# Patient Record
Sex: Female | Born: 1965 | Race: White | Hispanic: No | Marital: Married
Health system: Midwestern US, Community
[De-identification: ages and names within clinical notes are randomized; demographics above are authoritative.]

## PROBLEM LIST (undated history)

## (undated) DIAGNOSIS — N979 Female infertility, unspecified: Secondary | ICD-10-CM

## (undated) DIAGNOSIS — T7840XA Allergy, unspecified, initial encounter: Secondary | ICD-10-CM

## (undated) DIAGNOSIS — C4491 Basal cell carcinoma of skin, unspecified: Secondary | ICD-10-CM

## (undated) DIAGNOSIS — M2242 Chondromalacia patellae, left knee: Secondary | ICD-10-CM

## (undated) DIAGNOSIS — M1990 Unspecified osteoarthritis, unspecified site: Secondary | ICD-10-CM

## (undated) DIAGNOSIS — E785 Hyperlipidemia, unspecified: Secondary | ICD-10-CM

## (undated) HISTORY — DX: Female infertility, unspecified: N97.9

## (undated) HISTORY — DX: Allergy, unspecified, initial encounter: T78.40XA

## (undated) HISTORY — DX: Basal cell carcinoma of skin, unspecified: C44.91

## (undated) HISTORY — PX: COLONOSCOPY: SHX174

## (undated) HISTORY — DX: Unspecified osteoarthritis, unspecified site: M19.90

## (undated) HISTORY — DX: Hyperlipidemia, unspecified: E78.5

## (undated) HISTORY — PX: GYNECOLOGIC CRYOSURGERY: SHX857

## (undated) HISTORY — PX: POLYPECTOMY: SHX149

## (undated) HISTORY — DX: Chondromalacia patellae, left knee: M22.42

## (undated) HISTORY — PX: BREAST ENHANCEMENT SURGERY: SHX7

---

## 1998-05-20 ENCOUNTER — Other Ambulatory Visit: Admission: RE | Admit: 1998-05-20 | Discharge: 1998-05-20 | Payer: Self-pay | Admitting: Obstetrics and Gynecology

## 1998-11-11 ENCOUNTER — Other Ambulatory Visit: Admission: RE | Admit: 1998-11-11 | Discharge: 1998-11-11 | Payer: Self-pay | Admitting: Obstetrics and Gynecology

## 1999-08-26 ENCOUNTER — Other Ambulatory Visit: Admission: RE | Admit: 1999-08-26 | Discharge: 1999-08-26 | Payer: Self-pay | Admitting: Obstetrics and Gynecology

## 2000-02-23 ENCOUNTER — Other Ambulatory Visit: Admission: RE | Admit: 2000-02-23 | Discharge: 2000-02-23 | Payer: Self-pay | Admitting: Obstetrics and Gynecology

## 2000-03-22 ENCOUNTER — Other Ambulatory Visit: Admission: RE | Admit: 2000-03-22 | Discharge: 2000-03-22 | Payer: Self-pay | Admitting: Obstetrics and Gynecology

## 2000-03-22 ENCOUNTER — Encounter (INDEPENDENT_AMBULATORY_CARE_PROVIDER_SITE_OTHER): Payer: Self-pay

## 2000-08-21 ENCOUNTER — Other Ambulatory Visit: Admission: RE | Admit: 2000-08-21 | Discharge: 2000-08-21 | Payer: Self-pay | Admitting: Obstetrics and Gynecology

## 2001-03-08 ENCOUNTER — Other Ambulatory Visit: Admission: RE | Admit: 2001-03-08 | Discharge: 2001-03-08 | Payer: Self-pay | Admitting: Obstetrics and Gynecology

## 2001-09-04 ENCOUNTER — Other Ambulatory Visit: Admission: RE | Admit: 2001-09-04 | Discharge: 2001-09-04 | Payer: Self-pay | Admitting: Obstetrics and Gynecology

## 2002-03-25 ENCOUNTER — Other Ambulatory Visit: Admission: RE | Admit: 2002-03-25 | Discharge: 2002-03-25 | Payer: Self-pay | Admitting: Obstetrics and Gynecology

## 2002-09-02 ENCOUNTER — Other Ambulatory Visit: Admission: RE | Admit: 2002-09-02 | Discharge: 2002-09-02 | Payer: Self-pay | Admitting: Obstetrics and Gynecology

## 2003-12-29 ENCOUNTER — Other Ambulatory Visit: Admission: RE | Admit: 2003-12-29 | Discharge: 2003-12-29 | Payer: Self-pay | Admitting: Obstetrics and Gynecology

## 2004-06-29 ENCOUNTER — Other Ambulatory Visit: Admission: RE | Admit: 2004-06-29 | Discharge: 2004-06-29 | Payer: Self-pay | Admitting: Obstetrics and Gynecology

## 2005-01-04 ENCOUNTER — Other Ambulatory Visit: Admission: RE | Admit: 2005-01-04 | Discharge: 2005-01-04 | Payer: Self-pay | Admitting: Obstetrics and Gynecology

## 2005-06-09 ENCOUNTER — Other Ambulatory Visit: Admission: RE | Admit: 2005-06-09 | Discharge: 2005-06-09 | Payer: Self-pay | Admitting: Obstetrics and Gynecology

## 2008-08-01 ENCOUNTER — Ambulatory Visit (HOSPITAL_COMMUNITY): Admission: RE | Admit: 2008-08-01 | Discharge: 2008-08-01 | Payer: Self-pay | Admitting: Obstetrics and Gynecology

## 2008-09-05 ENCOUNTER — Ambulatory Visit (HOSPITAL_COMMUNITY): Admission: RE | Admit: 2008-09-05 | Discharge: 2008-09-05 | Payer: Self-pay | Admitting: Obstetrics and Gynecology

## 2008-11-02 ENCOUNTER — Inpatient Hospital Stay (HOSPITAL_COMMUNITY): Admission: AD | Admit: 2008-11-02 | Discharge: 2008-11-02 | Payer: Self-pay | Admitting: Obstetrics and Gynecology

## 2008-11-10 ENCOUNTER — Encounter (INDEPENDENT_AMBULATORY_CARE_PROVIDER_SITE_OTHER): Payer: Self-pay | Admitting: Obstetrics and Gynecology

## 2008-11-10 ENCOUNTER — Inpatient Hospital Stay (HOSPITAL_COMMUNITY): Admission: AD | Admit: 2008-11-10 | Discharge: 2008-11-13 | Payer: Self-pay | Admitting: Obstetrics and Gynecology

## 2008-11-10 ENCOUNTER — Inpatient Hospital Stay (HOSPITAL_COMMUNITY): Admission: AD | Admit: 2008-11-10 | Discharge: 2008-11-10 | Payer: Self-pay | Admitting: Obstetrics & Gynecology

## 2010-08-01 ENCOUNTER — Encounter: Admission: RE | Admit: 2010-08-01 | Discharge: 2010-08-01 | Payer: Self-pay | Admitting: Neurology

## 2010-11-23 ENCOUNTER — Emergency Department (HOSPITAL_COMMUNITY)
Admission: EM | Admit: 2010-11-23 | Discharge: 2010-11-23 | Payer: Self-pay | Source: Home / Self Care | Admitting: Emergency Medicine

## 2010-11-24 LAB — DIFFERENTIAL
Basophils Absolute: 0 K/uL (ref 0.0–0.1)
Basophils Relative: 0 % (ref 0–1)
Eosinophils Absolute: 0 K/uL (ref 0.0–0.7)
Eosinophils Relative: 0 % (ref 0–5)
Lymphocytes Relative: 8 % — ABNORMAL LOW (ref 12–46)
Lymphs Abs: 1.5 K/uL (ref 0.7–4.0)
Monocytes Absolute: 1.5 K/uL — ABNORMAL HIGH (ref 0.1–1.0)
Monocytes Relative: 8 % (ref 3–12)
Neutro Abs: 15.9 K/uL — ABNORMAL HIGH (ref 1.7–7.7)
Neutrophils Relative %: 84 % — ABNORMAL HIGH (ref 43–77)

## 2010-11-24 LAB — HEPATIC FUNCTION PANEL
ALT: 14 U/L (ref 0–35)
AST: 17 U/L (ref 0–37)
Albumin: 3.6 g/dL (ref 3.5–5.2)
Alkaline Phosphatase: 47 U/L (ref 39–117)
Bilirubin, Direct: 0.1 mg/dL (ref 0.0–0.3)
Indirect Bilirubin: 0.9 mg/dL (ref 0.3–0.9)
Total Bilirubin: 1 mg/dL (ref 0.3–1.2)
Total Protein: 6.8 g/dL (ref 6.0–8.3)

## 2010-11-24 LAB — BASIC METABOLIC PANEL
BUN: 20 mg/dL (ref 6–23)
CO2: 23 mEq/L (ref 19–32)
Calcium: 8.8 mg/dL (ref 8.4–10.5)
Chloride: 108 mEq/L (ref 96–112)
Creatinine, Ser: 0.73 mg/dL (ref 0.4–1.2)
GFR calc Af Amer: >60 mL/min (ref >60)
GFR calc non Af Amer: >60 mL/min (ref >60)
Glucose, Bld: 106 mg/dL — ABNORMAL HIGH (ref 70–99)
Potassium: 4 mEq/L (ref 3.5–5.1)
Sodium: 138 mEq/L (ref 135–145)

## 2010-11-24 LAB — URINALYSIS, ROUTINE W REFLEX MICROSCOPIC
Bilirubin Urine: NEGATIVE
Hgb urine dipstick: NEGATIVE
Nitrite: NEGATIVE
Protein, ur: NEGATIVE mg/dL
Specific Gravity, Urine: 1.023 (ref 1.005–1.030)
Urine Glucose, Fasting: NEGATIVE mg/dL
Urobilinogen, UA: 0.2 mg/dL (ref 0.0–1.0)
pH: 5 (ref 5.0–8.0)

## 2010-11-24 LAB — LIPASE, BLOOD: Lipase: 24 U/L (ref 11–59)

## 2010-11-24 LAB — CBC
HCT: 39.4 % (ref 36.0–46.0)
Hemoglobin: 13.9 g/dL (ref 12.0–15.0)
MCH: 32.6 pg (ref 26.0–34.0)
MCHC: 35.3 g/dL (ref 30.0–36.0)
MCV: 92.5 fL (ref 78.0–100.0)
Platelets: 196 K/uL (ref 150–400)
RBC: 4.26 MIL/uL (ref 3.87–5.11)
RDW: 12.3 % (ref 11.5–15.5)
WBC: 18.9 K/uL — ABNORMAL HIGH (ref 4.0–10.5)

## 2010-11-24 LAB — PREGNANCY, URINE: Preg Test, Ur: NEGATIVE

## 2010-11-28 ENCOUNTER — Encounter: Payer: Self-pay | Admitting: Neurology

## 2010-11-29 ENCOUNTER — Encounter: Payer: Self-pay | Admitting: Obstetrics and Gynecology

## 2011-01-24 ENCOUNTER — Other Ambulatory Visit: Payer: Self-pay | Admitting: Internal Medicine

## 2011-01-24 DIAGNOSIS — R0789 Other chest pain: Secondary | ICD-10-CM

## 2011-01-25 ENCOUNTER — Ambulatory Visit
Admission: RE | Admit: 2011-01-25 | Discharge: 2011-01-25 | Disposition: A | Discharge status: Home / Self Care | Payer: 59 | Source: Ambulatory Visit | Attending: Internal Medicine | Admitting: Internal Medicine

## 2011-01-25 DIAGNOSIS — R0789 Other chest pain: Secondary | ICD-10-CM

## 2011-01-25 MED ORDER — IOHEXOL 300 MG/ML  SOLN
100.0000 mL | Freq: Once | INTRAMUSCULAR | Status: AC | PRN
  Administered 2011-01-25: 100 mL via INTRAVENOUS

## 2011-02-04 ENCOUNTER — Other Ambulatory Visit: Payer: Self-pay | Admitting: Internal Medicine

## 2011-02-04 DIAGNOSIS — E041 Nontoxic single thyroid nodule: Secondary | ICD-10-CM

## 2011-02-08 ENCOUNTER — Ambulatory Visit
Admission: RE | Admit: 2011-02-08 | Discharge: 2011-02-08 | Disposition: A | Discharge status: Home / Self Care | Payer: 59 | Source: Ambulatory Visit | Attending: Internal Medicine | Admitting: Internal Medicine

## 2011-02-08 DIAGNOSIS — E041 Nontoxic single thyroid nodule: Secondary | ICD-10-CM

## 2011-02-09 ENCOUNTER — Other Ambulatory Visit: Payer: Self-pay | Admitting: Internal Medicine

## 2011-02-09 DIAGNOSIS — E041 Nontoxic single thyroid nodule: Secondary | ICD-10-CM

## 2011-02-15 ENCOUNTER — Other Ambulatory Visit: Payer: Self-pay | Admitting: Interventional Radiology

## 2011-02-15 ENCOUNTER — Other Ambulatory Visit (HOSPITAL_COMMUNITY)
Admission: RE | Admit: 2011-02-15 | Discharge: 2011-02-15 | Disposition: A | Discharge status: Home / Self Care | Payer: 59 | Source: Ambulatory Visit | Attending: Interventional Radiology | Admitting: Interventional Radiology

## 2011-02-15 ENCOUNTER — Ambulatory Visit
Admission: RE | Admit: 2011-02-15 | Discharge: 2011-02-15 | Disposition: A | Discharge status: Home / Self Care | Payer: 59 | Source: Ambulatory Visit | Attending: Internal Medicine | Admitting: Internal Medicine

## 2011-02-15 DIAGNOSIS — E049 Nontoxic goiter, unspecified: Secondary | ICD-10-CM | POA: Insufficient documentation

## 2011-02-15 DIAGNOSIS — E041 Nontoxic single thyroid nodule: Secondary | ICD-10-CM

## 2011-02-21 LAB — CBC
HCT: 31.1 % — ABNORMAL LOW (ref 36.0–46.0)
HCT: 42 % (ref 36.0–46.0)
Hemoglobin: 10.8 g/dL — ABNORMAL LOW (ref 12.0–15.0)
Hemoglobin: 14.3 g/dL (ref 12.0–15.0)
MCHC: 34.1 g/dL (ref 30.0–36.0)
MCV: 97.5 fL (ref 78.0–100.0)
MCV: 97.5 fL (ref 78.0–100.0)
MCV: 98.6 fL (ref 78.0–100.0)
Platelets: 120 10*3/uL — ABNORMAL LOW (ref 150–400)
Platelets: 126 10*3/uL — ABNORMAL LOW (ref 150–400)
Platelets: 171 10*3/uL (ref 150–400)
RBC: 3.07 MIL/uL — ABNORMAL LOW (ref 3.87–5.11)
RBC: 3.15 MIL/uL — ABNORMAL LOW (ref 3.87–5.11)
RBC: 4.3 MIL/uL (ref 3.87–5.11)
RDW: 13.3 % (ref 11.5–15.5)
WBC: 10 10*3/uL (ref 4.0–10.5)
WBC: 11.1 10*3/uL — ABNORMAL HIGH (ref 4.0–10.5)
WBC: 8 10*3/uL (ref 4.0–10.5)

## 2011-02-21 LAB — TYPE AND SCREEN
ABO/RH(D): O POS
Antibody Screen: NEGATIVE

## 2011-02-21 LAB — ABO/RH: ABO/RH(D): O POS

## 2011-02-21 LAB — RPR: RPR Ser Ql: NONREACTIVE

## 2011-03-22 NOTE — Op Note (Signed)
NAMEKENLY, Kim Hester                   ACCOUNT NO.:  [id number]   MEDICAL RECORD NO.:  [id number]          PATIENT TYPE:  INP   LOCATION:  9107                          FACILITY:  [facility]   PHYSICIAN:  Duke Salvia. Marcelle Overlie, M.D.DATE OF BIRTH:  1966/03/08   DATE OF PROCEDURE:  11/10/2008  DATE OF DISCHARGE:                               OPERATIVE REPORT   PREOPERATIVE DIAGNOSIS:  Fetal intolerance to labor with fetal  tachycardia and decelerations.   POSTOPERATIVE DIAGNOSIS:  Fetal intolerance to labor with fetal  tachycardia and decelerations.   PROCEDURE:  Primary low transverse cesarean section.   SURGEON:  Duke Salvia. Marcelle Overlie, MD   ANESTHESIA:  Spinal.   COMPLICATIONS:  None.   DRAINS:  Foley catheter.   BLOOD LOSS:  700.   PROCEDURE AND FINDINGS:  The patient was taken to the operating room  after an adequate level of spinal anesthetic was obtained with the  patient in the left-tilt position.  The abdomen was prepped and draped  in the usual manner for cesarean procedure.  Foley catheter positioned,  draining clear urine.  A transverse Pfannenstiel incision made 2  fingerbreadths above the symphysis, carried down to the fascia which was  incised and extended transversely.  Rectus muscle was divided in the  midline.  Peritoneum entered superiorly without incident and extended  vertically.  The vesicouterine serosa was then incised and the bladder  was bluntly and sharply dissected below.  Bladder blade was  repositioned.  A transverse incision was made in the lower segment.  Clear fluid noted extended with bandage scissors.  The VE was used to  effect delivery easily of a female, Apgars 9 and 9, cord pH was sent.  The  infant was suctioned, cord clamped, and passed to pediatric team for  further care.  Placenta was removed manually intact, sent to pathology.  The cavity was wiped clear and noted to be clean.  Closure obtained with  a first layer of 0 chromic in a locked fashion  followed by an  imbricating layer of 0 chromic.  This was hemostatic.  The bladder flap  area was intact and hemostatic.  Urine noted to be clear at that point.  Bilateral tubes and ovaries were normal.  Prior to closure, sponge,  needle, and instrument counts were reported as correct x2.  Peritoneum  closed with running 3-0 Vicryl suture.  Zero PDS was then used to close  the fascia from laterally to midline on either side.  The subcutaneous  tissue was hemostatic.  Clips and Steri-Strips were used on the skin.  She tolerated this well, went to recovery room in good condition.  She  was already on penicillin for GBS predelivery.     Richard M. Marcelle Overlie, M.D.  Electronically Signed    RMH/MEDQ  D:  11/10/2008  T:  11/11/2008  Job:  [id number]

## 2011-03-25 NOTE — Discharge Summary (Signed)
NAMEMICHAELENE, Kim Hester                   ACCOUNT NO.:  [id number]   MEDICAL RECORD NO.:  [id number]          PATIENT TYPE:  INP   LOCATION:  9107                          FACILITY:  [facility]   PHYSICIAN:  Dineen Kid. Rana Snare, M.D.    DATE OF BIRTH:  [birth year]   DATE OF ADMISSION:  11/10/2008  DATE OF DISCHARGE:  11/13/2008                               DISCHARGE SUMMARY   ADMITTING DIAGNOSES:  1. Intrauterine pregnancy at term.  2. Spontaneous rupture of membranes.  3. Questionable cervical stenosis.   DISCHARGE DIAGNOSES:  1. Status post low transverse cesarean section secondary to      intrapartum fetal distress.  2. A viable female infant.   PROCEDURE:  Primary low transverse cesarean section.   REASON FOR ADMISSION:  Please see written H&P.   HOSPITAL COURSE:  The patient is a 45 year old primigravida, who was  admitted to [facility] at term with spontaneous rupture  of membranes.  The patient did have in vitro fertilization.  She was  known to be positive for group B beta strep; otherwise, pregnancy had  been uncomplicated.  On admission, vital signs were stable.  Fetal heart  tones were in the range of 142.  Cervix was closed, 50% effaced with  vertex presentation.  Spontaneous rupture of membranes was noted with  clear fluid.  IV antibiotics was started and Pitocin was started for  augmentation of her labor.  Shortly thereafter, fetal heart tones were  nonreassuring and decision was made to proceed with a primary low  transverse cesarean section secondary to cervix closed with questionable  stenosis, secondary to cryotherapy, and nonreassuring fetal heart tones.  The patient was then transferred to the operating room, where spinal  anesthesia was administered without difficulty.  A low transverse  incision was made with delivery of a viable female infant weighing 6  pounds 2 ounces with Apgars of 9 at 1 minute and 9 at 5 minutes.  The  patient tolerated the procedure well  and was taken to the recovery room  in stable condition.  On postoperative day #1, the patient denied  headache or blurred vision or right upper quadrant pain.  Vital signs  were stable.  Temperature max of 100.8 and blood pressure 116/73.  Abdomen soft with decreased bowel sounds.  Fundus firm and nontender.  Abdominal dressing noted to have a scant amount of drainage noted on the  bandage.  Laboratory findings revealed a hemoglobin 10.4, platelet count  of 122,000, and WBC count of 8.0.  CBC was ordered for the following  morning and vital signs were ordered to be every 4 hours.  IV was  reduced to a saline lock.  On postoperative day #2, the patient did  complain of some incisional pain.  She had been allergic to [facility]  with some associated nausea.  Vital signs were stable.  Abdomen slightly  distended with good return of bowel function.  Fundus was firm and  nontender.  Incision was clean, dry, and intact.  Laboratory findings  revealed hemoglobin of 10.8 and platelet count was  up to 126,000.  Pain  medication was changed to Dilaudid.  On postoperative day #3, the  patient was feeling better.  She had had good results in rectal  suppository and good pain relief with Dilaudid.  Vital signs were  stable.  She was afebrile.  Fundus firm and nontender.  Incision was  clean, dry, and intact.  The staples were removed and the patient was  later discharged to home.   CONDITION ON DISCHARGE:  Stable.   DIET:  Regular as tolerated.   ACTIVITY:  No heavy lifting, no driving x2 weeks, and no vaginal entry.   FOLLOWUP:  The patient is to follow up in the office in 1 week for an  incision check.  She is to call for temperature greater than 100  degrees, persistent nausea, vomiting, heavy vaginal bleeding and/or  redness or drainage from incisional site.   DISCHARGE MEDICATIONS:  1. Dilaudid 2 mg 1 p.o. every 4 hours.  2. Motrin 600 mg every 6 hours.  3. Prenatal vitamins 1 p.o. daily.   4. Colace 1 p.o. daily p.r.n.      Julio Sicks, N.P.      Dineen Kid Rana Snare, M.D.  Electronically Signed    CC/MEDQ  D:  12/11/2008  T:  12/11/2008  Job:  [id number]

## 2011-05-31 ENCOUNTER — Other Ambulatory Visit: Payer: Self-pay | Admitting: Dermatology

## 2011-06-01 ENCOUNTER — Other Ambulatory Visit: Payer: Self-pay | Admitting: Internal Medicine

## 2011-06-02 ENCOUNTER — Ambulatory Visit
Admission: RE | Admit: 2011-06-02 | Discharge: 2011-06-02 | Disposition: A | Discharge status: Home / Self Care | Payer: 59 | Source: Ambulatory Visit | Attending: Internal Medicine | Admitting: Internal Medicine

## 2011-06-06 ENCOUNTER — Other Ambulatory Visit (INDEPENDENT_AMBULATORY_CARE_PROVIDER_SITE_OTHER): Payer: 59

## 2011-06-06 ENCOUNTER — Ambulatory Visit (INDEPENDENT_AMBULATORY_CARE_PROVIDER_SITE_OTHER): Payer: 59 | Admitting: Gastroenterology

## 2011-06-06 ENCOUNTER — Encounter: Payer: Self-pay | Admitting: Gastroenterology

## 2011-06-06 DIAGNOSIS — R198 Other specified symptoms and signs involving the digestive system and abdomen: Secondary | ICD-10-CM

## 2011-06-06 DIAGNOSIS — R109 Unspecified abdominal pain: Secondary | ICD-10-CM

## 2011-06-06 LAB — COMPREHENSIVE METABOLIC PANEL
BUN: 22 mg/dL (ref 6–23)
CO2: 29 mEq/L (ref 19–32)
Calcium: 9 mg/dL (ref 8.4–10.5)
Chloride: 107 mEq/L (ref 96–112)
Creatinine, Ser: 0.6 mg/dL (ref 0.4–1.2)
GFR: 115.16 mL/min (ref >60.00)
Glucose, Bld: 99 mg/dL (ref 70–99)
Total Bilirubin: 0.5 mg/dL (ref 0.3–1.2)

## 2011-06-06 MED ORDER — PEG-KCL-NACL-NASULF-NA ASC-C 100 G PO SOLR: 1.0000 | ORAL | Status: DC

## 2011-06-06 NOTE — Patient Instructions (Addendum)
MRCP to examine biliary dilation.  [facility] 06/09/11 8 am arrive at 745 am nothing to eat or drink after midnight. You will have labs checked today in the basement lab.  Please head down after you check out with the front desk  (cmet). You will be set up for a colonoscopy for rectal discomfort. A copy of this information will be made available to Dr. Felipa Eth. We will get records from CT scan ([facility]?) this past few months.

## 2011-06-06 NOTE — Progress Notes (Signed)
HPI: This is a  very pleasant 45 year old woman who has had upper and lower GI symptoms over the past several months.  "very in tune with body".  A year ago she sat on floor, felt like she was sitting on a bubble, discomfort in anal area.  She had a Brain CT scan, normal.  In January, had intense abd pain, awoke her from sleep.  Epigastrium pain, no nausea or vomiting.  She had a BM without relief.  In ambulance pain receded.  Saw her PCP, Dr. Felipa Eth.  Had CT in January (results not available).  Had abd [place] earlier this week and it showed slightly dilated common bile duct to 8.24mm but normal gallbladder.   Cbc was normal this week.  UA +trace microscopic blood.  Liver testsmarch 2012 were normal.  Not repeated this week  One week ago she had recurrent abd pain, wax and wane throughout the day.  Radiated to back. No associated nausea or vomiting.  Took some tylenol with codiene that helped (two pills).  Episodic pain for 3 days.  Went to [facility], underwent examination and had left kidney tenderness.  Was constipated, definitely out of the ordinary.  Saw [facility].  Also mild LUQ discomfort.  Prrged with miralax late last week with good success.    Bowels a bit loose now.  No blood in stools.  + mucousy stools.    Overall stable weight.      Review of systems: Pertinent positive and negative review of systems were noted in the above HPI section.  All other review of systems was otherwise negative.   Past Medical History  Diagnosis Date  . Infertility, female   . Chondromalacia of left patella   . Hyperlipidemia   . Basal cell carcinoma     Past Surgical History  Procedure Date  . Gynecologic cryosurgery   . Breast enhancement surgery   . Cesarean section      reports that she has never smoked. She has never used smokeless tobacco. She reports that she drinks alcohol. She reports that she does not use illicit drugs.  family history includes Hypertension in her father.  There is  no history of Colon cancer.  She is adopted.    Current Medications, Allergies were all reviewed with the patient via [facility] electronic medical record system.    Physical Exam: BP 100/64  Pulse 68  Ht 5\' 2"  (1.575 m)  Wt 125 lb (56.7 kg)  BMI 22.86 kg/m2  LMP 05/15/2011 Constitutional: generally well-appearing Psychiatric: alert and oriented x3 Eyes: extraocular movements intact Mouth: oral pharynx moist, no lesions Neck: supple no lymphadenopathy Cardiovascular: heart regular rate and rhythm Lungs: clear to auscultation bilaterally Abdomen: soft, nontender, nondistended, no obvious ascites, no peritoneal signs, normal bowel sounds Extremities: no lower extremity edema bilaterally Skin: no lesions on visible extremities    Assessment and plan: 45 y.o. female with 8.1 mm common bile duct on recent ultrasound, intermittent epigastric discomforts, rectal discomfort and recent constipation  We will do records from her from March 2012 CT scan done at [facility]. It does sound as if this was normal. Her bile duct was slightly dilated on recent abdominal ultrasound and some of her symptoms do indeed seem biliary. No gallstones were seen on ultrasound but perhaps she has common bile duct pathology such as choledocholithiasis. She will get a set of liver tests today and we are going to arrange for an MRCP. A separate issue  is that of rectal discomfort, the sensation of having a bubble in her backside. She has had some mucousy stools, recent constipation and we should proceed with full colonoscopy at her soonest convenience.  Her husband is changing jobs and so insurance is an issue currently, they are going to use COBRA I tried to answer all of her questions about this she is going to call her husband HR contact as well.Marland Kitchen

## 2011-06-07 ENCOUNTER — Telehealth: Payer: Self-pay | Admitting: Gastroenterology

## 2011-06-07 ENCOUNTER — Other Ambulatory Visit: Payer: Self-pay | Admitting: Obstetrics and Gynecology

## 2011-06-07 DIAGNOSIS — R928 Other abnormal and inconclusive findings on diagnostic imaging of breast: Secondary | ICD-10-CM

## 2011-06-07 NOTE — Telephone Encounter (Signed)
I advised the pt to keep the appt for now and we will have Morrie Sheldon get a precert and if there are any problems we will call her  Pt agreed

## 2011-06-10 ENCOUNTER — Telehealth: Payer: Self-pay | Admitting: Gastroenterology

## 2011-06-10 NOTE — Telephone Encounter (Signed)
CT scan IV and PO contrast [facility], 11/2010: no acute findings, + consipation

## 2011-06-13 ENCOUNTER — Encounter: Payer: Self-pay | Admitting: Gastroenterology

## 2011-06-13 ENCOUNTER — Ambulatory Visit (AMBULATORY_SURGERY_CENTER): Payer: 59 | Admitting: Gastroenterology

## 2011-06-13 DIAGNOSIS — R109 Unspecified abdominal pain: Secondary | ICD-10-CM

## 2011-06-13 DIAGNOSIS — D126 Benign neoplasm of colon, unspecified: Secondary | ICD-10-CM

## 2011-06-13 DIAGNOSIS — R198 Other specified symptoms and signs involving the digestive system and abdomen: Secondary | ICD-10-CM

## 2011-06-13 MED ORDER — SODIUM CHLORIDE 0.9 % IV SOLN: 500.0000 mL | INTRAVENOUS | Status: DC

## 2011-06-13 NOTE — Progress Notes (Signed)
Pt c/o having nausea with any kind of anesthesia or pain medications.  Per the pt she request to go very light on the sedation.  She will discuss this with Dr. Christella Hartigan and her nurse in the procedure room.  MAW

## 2011-06-13 NOTE — Progress Notes (Signed)
Pt states that she has experienced severe nausea with anesthesia. Stated that she would like minimal sedation, MD at bedside.

## 2011-06-14 ENCOUNTER — Telehealth: Payer: Self-pay | Admitting: *Deleted

## 2011-06-14 NOTE — Telephone Encounter (Signed)

## 2011-06-15 ENCOUNTER — Ambulatory Visit
Admission: RE | Admit: 2011-06-15 | Discharge: 2011-06-15 | Disposition: A | Discharge status: Home / Self Care | Payer: 59 | Source: Ambulatory Visit | Attending: Obstetrics and Gynecology | Admitting: Obstetrics and Gynecology

## 2011-06-15 DIAGNOSIS — R928 Other abnormal and inconclusive findings on diagnostic imaging of breast: Secondary | ICD-10-CM

## 2011-06-17 ENCOUNTER — Ambulatory Visit (HOSPITAL_COMMUNITY)
Admission: RE | Admit: 2011-06-17 | Discharge: 2011-06-17 | Disposition: A | Discharge status: Home / Self Care | Payer: 59 | Source: Ambulatory Visit | Attending: Gastroenterology | Admitting: Gastroenterology

## 2011-06-17 DIAGNOSIS — K838 Other specified diseases of biliary tract: Secondary | ICD-10-CM | POA: Insufficient documentation

## 2011-06-17 DIAGNOSIS — R109 Unspecified abdominal pain: Secondary | ICD-10-CM | POA: Insufficient documentation

## 2011-06-17 DIAGNOSIS — K7689 Other specified diseases of liver: Secondary | ICD-10-CM | POA: Insufficient documentation

## 2011-06-17 MED ORDER — GADOBENATE DIMEGLUMINE 529 MG/ML IV SOLN
11.0000 mL | Freq: Once | INTRAVENOUS | Status: AC | PRN
  Administered 2011-06-17: 11 mL via INTRAVENOUS

## 2011-06-21 ENCOUNTER — Other Ambulatory Visit: Payer: Self-pay | Admitting: Gastroenterology

## 2011-06-21 DIAGNOSIS — R933 Abnormal findings on diagnostic imaging of other parts of digestive tract: Secondary | ICD-10-CM

## 2011-06-21 NOTE — Progress Notes (Signed)
Records printed and faxed to Dr Lanell Matar [phone]

## 2011-08-12 LAB — CBC
MCHC: 34.4 g/dL (ref 30.0–36.0)
MCV: 97.2 fL (ref 78.0–100.0)
RBC: 3.64 MIL/uL — ABNORMAL LOW (ref 3.87–5.11)
RDW: 13.2 % (ref 11.5–15.5)

## 2011-08-12 LAB — URINE MICROSCOPIC-ADD ON

## 2011-08-12 LAB — COMPREHENSIVE METABOLIC PANEL
CO2: 23 mEq/L (ref 19–32)
Calcium: 9.2 mg/dL (ref 8.4–10.5)
Creatinine, Ser: 0.68 mg/dL (ref 0.4–1.2)
GFR calc Af Amer: >60 mL/min (ref >60)
GFR calc non Af Amer: >60 mL/min (ref >60)
Glucose, Bld: 101 mg/dL — ABNORMAL HIGH (ref 70–99)
Sodium: 135 mEq/L (ref 135–145)
Total Protein: 6.1 g/dL (ref 6.0–8.3)

## 2011-08-12 LAB — URINALYSIS, ROUTINE W REFLEX MICROSCOPIC
Bilirubin Urine: NEGATIVE
Leukocytes, UA: NEGATIVE
Nitrite: NEGATIVE
Specific Gravity, Urine: 1.01 (ref 1.005–1.030)
Urobilinogen, UA: 0.2 mg/dL (ref 0.0–1.0)
pH: 5.5 (ref 5.0–8.0)

## 2011-08-12 LAB — LACTATE DEHYDROGENASE: LDH: 233 U/L (ref 94–250)

## 2011-08-16 ENCOUNTER — Telehealth: Payer: Self-pay | Admitting: Gastroenterology

## 2011-08-16 NOTE — Telephone Encounter (Signed)
Pt was given the info on her sedation off of her procedure report

## 2011-08-24 ENCOUNTER — Encounter: Payer: Self-pay | Admitting: Gastroenterology

## 2012-01-18 ENCOUNTER — Telehealth: Payer: Self-pay | Admitting: Gastroenterology

## 2012-01-18 NOTE — Telephone Encounter (Signed)
Staff message to remind pt to have MRI

## 2012-01-18 NOTE — Telephone Encounter (Signed)
I spoke with Kim Hester, please put her reminder system for a pancreatic MRI in [facility] July 2013, to follow up on the pancreatic cyst.    Can you let her know that we will get in touch as the date gets closer to schedule it.  thanks

## 2012-02-17 ENCOUNTER — Telehealth: Payer: Self-pay

## 2012-02-17 NOTE — Telephone Encounter (Signed)
Message copied by Donata Duff on Fri Feb 17, 2012  8:03 AM ------      Message from: Donata Duff      Created: Fri Aug 19, 2011  2:58 PM       Pt needs MRI pancreatic 6 months per Dr Christella Hartigan note on EUS report to be scanned into [facility]

## 2012-02-17 NOTE — Telephone Encounter (Signed)
Left message on machine to call back  

## 2012-02-21 NOTE — Telephone Encounter (Signed)
Left message on machine to call back  Letter mailed 

## 2012-02-28 ENCOUNTER — Telehealth: Payer: Self-pay

## 2012-02-28 DIAGNOSIS — R932 Abnormal findings on diagnostic imaging of liver and biliary tract: Secondary | ICD-10-CM

## 2012-02-28 NOTE — Telephone Encounter (Signed)
Tue Thurs better for MRI I will schedule and call her back with the date and time

## 2012-02-28 NOTE — Telephone Encounter (Signed)
[facility] May 2 8 am NPO after midnight arrive 745 am.pt has been notified and instructed.

## 2012-03-05 ENCOUNTER — Telehealth: Payer: Self-pay | Admitting: Gastroenterology

## 2012-03-05 NOTE — Telephone Encounter (Signed)
MRI has been cancelled and a reminder to schedule the pt in July

## 2012-03-05 NOTE — Telephone Encounter (Signed)
Dr Felipa Eth can you please review,  The pt is questioning f/u for thyroid nodule.

## 2012-03-05 NOTE — Telephone Encounter (Signed)
Nigel contacted me to ask about thyroid follow up. She had thyroid nodule noted last year, underwent biopsy.  I am uncertain if she needs further thyroid testing and what type of follow up is reasonable.  Her PCP, Dr. Felipa Eth was involved with the thyroid workup and I told her I would forward her question to him.  Patty, Can you contact Dr. Felipa Eth.  I think it would be best if they get in touch with her to communicate with her directly about thyroid testing, follow up.  thanks

## 2012-03-05 NOTE — Telephone Encounter (Signed)
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   thanks

## 2012-03-08 ENCOUNTER — Other Ambulatory Visit (HOSPITAL_COMMUNITY): Payer: 59

## 2012-03-13 ENCOUNTER — Other Ambulatory Visit: Payer: Self-pay | Admitting: Obstetrics and Gynecology

## 2012-03-13 ENCOUNTER — Encounter: Payer: Self-pay | Admitting: *Deleted

## 2012-03-13 DIAGNOSIS — Z1231 Encounter for screening mammogram for malignant neoplasm of breast: Secondary | ICD-10-CM

## 2012-03-14 ENCOUNTER — Ambulatory Visit (INDEPENDENT_AMBULATORY_CARE_PROVIDER_SITE_OTHER): Payer: 59 | Admitting: *Deleted

## 2012-03-14 DIAGNOSIS — I781 Nevus, non-neoplastic: Secondary | ICD-10-CM

## 2012-03-14 NOTE — Progress Notes (Signed)
Ended up just doing a sclero consult. Patient's veins are tiny but told her she would look worse before she looks better. 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

## 2012-04-05 ENCOUNTER — Other Ambulatory Visit: Payer: Self-pay | Admitting: Dermatology

## 2012-04-26 ENCOUNTER — Other Ambulatory Visit: Payer: Self-pay | Admitting: Internal Medicine

## 2012-04-26 DIAGNOSIS — E041 Nontoxic single thyroid nodule: Secondary | ICD-10-CM

## 2012-05-03 ENCOUNTER — Other Ambulatory Visit: Payer: 59

## 2012-05-07 ENCOUNTER — Telehealth: Payer: Self-pay

## 2012-05-07 NOTE — Telephone Encounter (Signed)
Message copied by Donata Duff on Mon May 07, 2012  8:01 AM ------      Message from: Donata Duff      Created: Wed Jan 18, 2012  4:25 PM       I spoke with Kim Hester, please put her reminder system for a pancreatic MRI in [facility] July 2013, to follow up on the pancreatic cyst.                    Can you let her know that we will get in touch as the date gets closer to schedule it.

## 2012-05-07 NOTE — Telephone Encounter (Signed)
Left message on machine to call back  

## 2012-05-08 ENCOUNTER — Ambulatory Visit
Admission: RE | Admit: 2012-05-08 | Discharge: 2012-05-08 | Disposition: A | Discharge status: Home / Self Care | Payer: PRIVATE HEALTH INSURANCE | Source: Ambulatory Visit | Attending: Internal Medicine | Admitting: Internal Medicine

## 2012-05-08 DIAGNOSIS — E041 Nontoxic single thyroid nodule: Secondary | ICD-10-CM

## 2012-05-08 NOTE — Telephone Encounter (Signed)
Left message on machine to call back letter mailed. 

## 2012-05-29 ENCOUNTER — Telehealth: Payer: Self-pay | Admitting: Gastroenterology

## 2012-05-30 NOTE — Telephone Encounter (Signed)
Left message on machine to call back  

## 2012-05-31 NOTE — Telephone Encounter (Signed)
Left message on machine to call back  

## 2012-06-01 NOTE — Telephone Encounter (Signed)
Pt returned call and says she will call back on Monday with a date that will work with her schedule regarding MRI

## 2012-06-07 ENCOUNTER — Telehealth: Payer: Self-pay

## 2012-06-07 DIAGNOSIS — K862 Cyst of pancreas: Secondary | ICD-10-CM

## 2012-06-07 NOTE — Telephone Encounter (Signed)
MRI has been scheduled for 06/14/12 1145 am arrive at 1115 am at [facility] at [facility] have nothing to eat or drink 4 hours prior to the appt.  Pt has been instructed

## 2012-06-14 ENCOUNTER — Ambulatory Visit
Admission: RE | Admit: 2012-06-14 | Discharge: 2012-06-14 | Disposition: A | Discharge status: Home / Self Care | Payer: PRIVATE HEALTH INSURANCE | Source: Ambulatory Visit | Attending: Gastroenterology | Admitting: Gastroenterology

## 2012-06-14 DIAGNOSIS — K862 Cyst of pancreas: Secondary | ICD-10-CM

## 2012-06-14 MED ORDER — GADOBENATE DIMEGLUMINE 529 MG/ML IV SOLN
11.0000 mL | Freq: Once | INTRAVENOUS | Status: AC | PRN
  Administered 2012-06-14: 11 mL via INTRAVENOUS

## 2012-06-19 ENCOUNTER — Ambulatory Visit
Admission: RE | Admit: 2012-06-19 | Discharge: 2012-06-19 | Disposition: A | Discharge status: Home / Self Care | Payer: PRIVATE HEALTH INSURANCE | Source: Ambulatory Visit | Attending: Obstetrics and Gynecology | Admitting: Obstetrics and Gynecology

## 2012-06-19 DIAGNOSIS — Z1231 Encounter for screening mammogram for malignant neoplasm of breast: Secondary | ICD-10-CM

## 2013-05-13 ENCOUNTER — Other Ambulatory Visit: Payer: Self-pay | Admitting: Internal Medicine

## 2013-05-13 DIAGNOSIS — E041 Nontoxic single thyroid nodule: Secondary | ICD-10-CM

## 2013-05-21 ENCOUNTER — Other Ambulatory Visit: Payer: PRIVATE HEALTH INSURANCE

## 2013-05-23 ENCOUNTER — Ambulatory Visit
Admission: RE | Admit: 2013-05-23 | Discharge: 2013-05-23 | Disposition: A | Discharge status: Home / Self Care | Payer: BC Managed Care – PPO | Source: Ambulatory Visit | Attending: Internal Medicine | Admitting: Internal Medicine

## 2013-05-23 DIAGNOSIS — E041 Nontoxic single thyroid nodule: Secondary | ICD-10-CM

## 2013-07-26 ENCOUNTER — Other Ambulatory Visit: Payer: Self-pay

## 2013-07-26 DIAGNOSIS — Z9882 Breast implant status: Secondary | ICD-10-CM

## 2013-07-26 DIAGNOSIS — Z1231 Encounter for screening mammogram for malignant neoplasm of breast: Secondary | ICD-10-CM

## 2013-08-27 ENCOUNTER — Ambulatory Visit
Admission: RE | Admit: 2013-08-27 | Discharge: 2013-08-27 | Disposition: A | Discharge status: Home / Self Care | Payer: BC Managed Care – PPO | Source: Ambulatory Visit

## 2013-08-27 DIAGNOSIS — Z9882 Breast implant status: Secondary | ICD-10-CM

## 2013-08-27 DIAGNOSIS — Z1231 Encounter for screening mammogram for malignant neoplasm of breast: Secondary | ICD-10-CM

## 2014-06-17 ENCOUNTER — Telehealth: Payer: Self-pay

## 2014-06-17 DIAGNOSIS — K862 Cyst of pancreas: Secondary | ICD-10-CM

## 2014-06-17 NOTE — Telephone Encounter (Signed)
Message copied by Barron Alvine on Tue Jun 17, 2014  8:31 AM ------      Message from: Barron Alvine      Created: Mon Jun 17, 2013  8:20 AM                   ----- Message -----         From: Barron Alvine, CMA         Sent: 06/15/2013           To: Barron Alvine, CMA            I spoke with her about the MRI results. 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 This is with 2 MRI evaluations as well as 1 endoscopic ultrasound at [facility]. Radiology recommended no further imaging however she is somewhat concerned about thought of not taking another picture at least in 1 year think it is reasonable.                   Please put in the reminder system for repeat MRI of pancreas in one year. If at that point if this still showed no change then I think we can just simply follow her clinically.               Attached to         ------

## 2014-06-19 NOTE — Telephone Encounter (Signed)
Left message on machine to call back  

## 2014-06-19 NOTE — Telephone Encounter (Signed)
You have been scheduled for an MRI at [facility] on 06/25/14 . Your appointment time is 430 pm. Please arrive 15 minutes prior to your appointment time for registration purposes. Please make certain not to have anything to eat or drink 6 hours prior to your test. In addition, if you have any metal in your body, have a pacemaker or defibrillator, please be sure to let your ordering physician know. This test typically takes 45 minutes to 1 hour to complete.  The pt is aware and has been instructed

## 2014-06-25 ENCOUNTER — Other Ambulatory Visit: Payer: BC Managed Care – PPO

## 2014-07-01 ENCOUNTER — Telehealth: Payer: Self-pay | Admitting: Gastroenterology

## 2014-07-01 ENCOUNTER — Other Ambulatory Visit: Payer: BC Managed Care – PPO

## 2014-07-01 MED ORDER — DIAZEPAM 5 MG PO TABS: 5.0000 mg | ORAL_TABLET | Freq: Once | ORAL | Status: DC

## 2014-07-01 NOTE — Telephone Encounter (Signed)
After speaking with MRI schedulers, she called, concerned about anxiety, claustrophobia during upcoming MRI. Asked about anxiolytics.  Patty, I wrote her for valium, 5mg  pill, take one and repeat if needed, disp 2.  Cannot e-prescribe it so she is planning to pick up the script sometime tomorrow at the office. Thanks

## 2014-07-02 MED ORDER — DIAZEPAM 5 MG PO TABS: 5.0000 mg | ORAL_TABLET | Freq: Once | ORAL | Status: DC

## 2014-07-02 NOTE — Telephone Encounter (Signed)
The rx was reprinted and left at the front desk for pick up

## 2014-07-03 ENCOUNTER — Ambulatory Visit
Admission: RE | Admit: 2014-07-03 | Discharge: 2014-07-03 | Disposition: A | Discharge status: Home / Self Care | Payer: BC Managed Care – PPO | Source: Ambulatory Visit | Attending: Gastroenterology | Admitting: Gastroenterology

## 2014-07-03 DIAGNOSIS — K862 Cyst of pancreas: Secondary | ICD-10-CM

## 2014-07-03 MED ORDER — GADOBENATE DIMEGLUMINE 529 MG/ML IV SOLN
10.0000 mL | Freq: Once | INTRAVENOUS | Status: AC | PRN
  Administered 2014-07-03: 10 mL via INTRAVENOUS

## 2016-02-12 ENCOUNTER — Ambulatory Visit: Payer: Self-pay | Admitting: Cardiology

## 2016-03-28 ENCOUNTER — Ambulatory Visit: Payer: Self-pay | Admitting: Cardiology

## 2016-05-25 ENCOUNTER — Encounter: Payer: Self-pay | Admitting: Gastroenterology

## 2016-11-29 ENCOUNTER — Encounter: Payer: Self-pay | Admitting: Gastroenterology

## 2017-01-20 ENCOUNTER — Ambulatory Visit (AMBULATORY_SURGERY_CENTER): Payer: Self-pay | Admitting: *Deleted

## 2017-01-20 VITALS — Ht 62.0 in | Wt 138.0 lb

## 2017-01-20 DIAGNOSIS — Z86010 Personal history of colonic polyps: Secondary | ICD-10-CM

## 2017-01-20 MED ORDER — NA SULFATE-K SULFATE-MG SULF 17.5-3.13-1.6 GM/177ML PO SOLN: 1.0000 | Freq: Once | ORAL | 0 refills | Status: AC

## 2017-01-20 NOTE — Progress Notes (Signed)
No egg or soy allergy known to patient  No issues with past sedation with any surgeries  or procedures, no intubation problems  No diet pills per patient No home 02 use per patient  No blood thinners per patient  Pt denies issues with constipation  No A fib or A flutter   

## 2017-01-23 ENCOUNTER — Encounter: Payer: Self-pay | Admitting: Gastroenterology

## 2017-01-27 ENCOUNTER — Ambulatory Visit (AMBULATORY_SURGERY_CENTER): Payer: BLUE CROSS/BLUE SHIELD | Admitting: Gastroenterology

## 2017-01-27 ENCOUNTER — Encounter: Payer: Self-pay | Admitting: Gastroenterology

## 2017-01-27 VITALS — BP 114/66 | HR 61 | Temp 98.4°F | Resp 13 | Ht 62.0 in | Wt 138.0 lb

## 2017-01-27 DIAGNOSIS — K635 Polyp of colon: Secondary | ICD-10-CM

## 2017-01-27 DIAGNOSIS — Z86010 Personal history of colonic polyps: Secondary | ICD-10-CM

## 2017-01-27 DIAGNOSIS — D123 Benign neoplasm of transverse colon: Secondary | ICD-10-CM

## 2017-01-27 MED ORDER — SODIUM CHLORIDE 0.9 % IV SOLN: 500.0000 mL | INTRAVENOUS | Status: AC

## 2017-01-27 NOTE — Patient Instructions (Signed)
YOU HAD AN ENDOSCOPIC PROCEDURE TODAY AT THE [facility]:   Refer to the procedure report that was given to you for any specific questions about what was found during the examination.  If the procedure report does not answer your questions, please call your gastroenterologist to clarify.  If you requested that your care partner not be given the details of your procedure findings, then the procedure report has been included in a sealed envelope for you to review at your convenience later.  YOU SHOULD EXPECT: Some feelings of bloating in the abdomen. Passage of more gas than usual.  Walking can help get rid of the air that was put into your GI tract during the procedure and reduce the bloating. If you had a lower endoscopy (such as a colonoscopy or flexible sigmoidoscopy) you may notice spotting of blood in your stool or on the toilet paper. If you underwent a bowel prep for your procedure, you may not have a normal bowel movement for a few days.  Please Note:  You might notice some irritation and congestion in your nose or some drainage.  This is from the oxygen used during your procedure.  There is no need for concern and it should clear up in a day or so.  SYMPTOMS TO REPORT IMMEDIATELY:   Following lower endoscopy (colonoscopy or flexible sigmoidoscopy):  Excessive amounts of blood in the stool  Significant tenderness or worsening of abdominal pains  Swelling of the abdomen that is new, acute  Fever of 100F or higher    For urgent or emergent issues, a gastroenterologist can be reached at any hour by calling [phone].   DIET:  We do recommend a small meal at first, but then you may proceed to your regular diet.  Drink plenty of fluids but you should avoid alcoholic beverages for 24 hours.  ACTIVITY:  You should plan to take it easy for the rest of today and you should NOT DRIVE or use heavy machinery until tomorrow (because of the sedation medicines used during the test).     FOLLOW UP: Our staff will call the number listed on your records the next business day following your procedure to check on you and address any questions or concerns that you may have regarding the information given to you following your procedure. If we do not reach you, we will leave a message.  However, if you are feeling well and you are not experiencing any problems, there is no need to return our call.  We will assume that you have returned to your regular daily activities without incident.  If any biopsies were taken you will be contacted by phone or by letter within the next 1-3 weeks.  Please call us at [phone] if you have not heard about the biopsies in 3 weeks.    SIGNATURES/CONFIDENTIALITY: You and/or your care partner have signed paperwork which will be entered into your electronic medical record.  These signatures attest to the fact that that the information above on your After Visit Summary has been reviewed and is understood.  Full responsibility of the confidentiality of this discharge information lies with you and/or your care-partner.   Information on polyps given to you today 

## 2017-01-27 NOTE — Progress Notes (Signed)
Pt's states no medical or surgical changes since previsit or office visit. 

## 2017-01-27 NOTE — Op Note (Signed)
[facility] Patient Name: Kim Hester Procedure Date: 01/27/2017 10:59 AM MRN: [id number] Endoscopist: Milus Banister , MD Age: 51 Referring MD:  Date of Birth: 1966/11/06 Gender: Female Account #: [id number] Procedure:                Colonoscopy Indications:              High risk colon cancer surveillance: Personal                            history of colonic polyps: colonoscopy 2012 Dr.                            Ardis Hughs found one subCM adenoma Medicines:                Monitored Anesthesia Care Procedure:                Pre-Anesthesia Assessment:                           - Prior to the procedure, a History and Physical                            was performed, and patient medications and                            allergies were reviewed. The patient's tolerance of                            previous anesthesia was also reviewed. The risks                            and benefits of the procedure and the sedation                            options and risks were discussed with the patient.                            All questions were answered, and informed consent                            was obtained. Prior Anticoagulants: The patient has                            taken no previous anticoagulant or antiplatelet                            agents. ASA Grade Assessment: II - A patient with                            mild systemic disease. After reviewing the risks                            and benefits, the patient was deemed in  satisfactory condition to undergo the procedure.                           After obtaining informed consent, the colonoscope                            was passed under direct vision. Throughout the                            procedure, the patient's blood pressure, pulse, and                            oxygen saturations were monitored continuously. The                            Colonoscope was introduced through  the anus and                            advanced to the the cecum, identified by                            appendiceal orifice and ileocecal valve. The                            colonoscopy was performed without difficulty. The                            patient tolerated the procedure well. The quality                            of the bowel preparation was excellent. The                            ileocecal valve, appendiceal orifice, and rectum                            were photographed. Scope In: 11:05:25 AM Scope Out: 11:17:08 AM Scope Withdrawal Time: 0 hours 8 minutes 14 seconds  Total Procedure Duration: 0 hours 11 minutes 43 seconds  Findings:                 A 3 mm polyp was found in the transverse colon. The                            polyp was sessile. The polyp was removed with a                            cold snare. Resection and retrieval were complete.                           The exam was otherwise without abnormality on                            direct and retroflexion views. Complications:  No immediate complications. Estimated blood loss:                            None. Estimated Blood Loss:     Estimated blood loss: none. Impression:               - One 3 mm polyp in the transverse colon, removed                            with a cold snare. Resected and retrieved.                           - The examination was otherwise normal on direct                            and retroflexion views. Recommendation:           - Patient has a contact number available for                            emergencies. The signs and symptoms of potential                            delayed complications were discussed with the                            patient. Return to normal activities tomorrow.                            Written discharge instructions were provided to the                            patient.                           - Resume previous diet.                            - Continue present medications.                           You will receive a letter within 2-3 weeks with the                            pathology results and my final recommendations.                           If the polyp(s) is proven to be 'pre-cancerous' on                            pathology, you will need repeat colonoscopy in 5                            years. If the polyp(s) is NOT 'precancerous' on  pathology then you should repeat colon cancer                            screening in 10 years with colonoscopy without need                            for colon cancer screening by any method prior to                            then (including stool testing). Milus Banister, MD 01/27/2017 11:25:41 AM This report has been signed electronically.

## 2017-01-27 NOTE — Progress Notes (Signed)
To recovery, report to [place], RN, VSS 

## 2017-01-30 ENCOUNTER — Telehealth: Payer: Self-pay | Admitting: *Deleted

## 2017-01-30 NOTE — Telephone Encounter (Signed)
  Follow up Call-  Call back number 01/27/2017  Post procedure Call Back phone  # [phone]  Permission to leave phone message Yes  Some recent data might be hidden    [facility]

## 2017-01-30 NOTE — Telephone Encounter (Signed)
  Follow up Call-  Call back number 01/27/2017  Post procedure Call Back phone  # [phone]  Permission to leave phone message Yes  Some recent data might be hidden     No answer at 2nd attempt to reach patient.  Left message on VM.

## 2017-02-06 ENCOUNTER — Encounter: Payer: Self-pay | Admitting: Gastroenterology

## 2019-06-14 ENCOUNTER — Other Ambulatory Visit: Payer: Self-pay

## 2021-04-20 ENCOUNTER — Other Ambulatory Visit: Payer: Self-pay | Admitting: Obstetrics and Gynecology

## 2021-04-20 DIAGNOSIS — Z1231 Encounter for screening mammogram for malignant neoplasm of breast: Secondary | ICD-10-CM

## 2021-06-14 ENCOUNTER — Ambulatory Visit: Payer: BLUE CROSS/BLUE SHIELD

## 2021-07-28 ENCOUNTER — Other Ambulatory Visit: Payer: Self-pay

## 2021-07-28 ENCOUNTER — Ambulatory Visit
Admission: RE | Admit: 2021-07-28 | Discharge: 2021-07-28 | Disposition: A | Discharge status: Home / Self Care | Payer: 59 | Source: Ambulatory Visit | Attending: Obstetrics and Gynecology | Admitting: Obstetrics and Gynecology

## 2021-07-28 ENCOUNTER — Ambulatory Visit: Payer: Self-pay

## 2021-07-28 ENCOUNTER — Other Ambulatory Visit: Payer: Self-pay | Admitting: Obstetrics and Gynecology

## 2021-07-28 DIAGNOSIS — Z1231 Encounter for screening mammogram for malignant neoplasm of breast: Secondary | ICD-10-CM

## 2021-08-13 ENCOUNTER — Other Ambulatory Visit: Payer: Self-pay | Admitting: Internal Medicine

## 2021-08-13 DIAGNOSIS — E785 Hyperlipidemia, unspecified: Secondary | ICD-10-CM

## 2021-09-03 ENCOUNTER — Ambulatory Visit
Admission: RE | Admit: 2021-09-03 | Discharge: 2021-09-03 | Disposition: A | Discharge status: Home / Self Care | Payer: 59 | Source: Ambulatory Visit | Attending: Internal Medicine | Admitting: Internal Medicine

## 2021-09-03 DIAGNOSIS — E785 Hyperlipidemia, unspecified: Secondary | ICD-10-CM

## 2023-04-05 ENCOUNTER — Inpatient Hospital Stay
Admit: 2023-04-05 | Discharge: 2023-04-05 | Disposition: A | Discharge status: Home / Self Care | Payer: PRIVATE HEALTH INSURANCE | Attending: Emergency Medicine

## 2023-04-05 ENCOUNTER — Emergency Department: Admit: 2023-04-05 | Payer: PRIVATE HEALTH INSURANCE

## 2023-04-05 DIAGNOSIS — R531 Weakness: Secondary | ICD-10-CM

## 2023-04-05 LAB — CBC WITH AUTO DIFFERENTIAL
Basophils %: 0.5 % (ref 0.0–2.0)
Basophils Absolute: 0.1 10*3/uL (ref 0.0–0.2)
Eosinophils %: 2.5 % (ref 0.0–7.0)
Eosinophils Absolute: 0.2 10*3/uL (ref 0.0–0.5)
Hematocrit: 40.1 % (ref 34.0–47.0)
Hemoglobin: 14 g/dL (ref 11.5–15.7)
Immature Grans (Abs): 0.04 10*3/uL (ref 0.00–0.06)
Immature Granulocytes %: 0.4 % (ref 0.0–0.6)
Lymphocytes Absolute: 2.5 10*3/uL (ref 1.0–3.2)
Lymphocytes: 27.2 % (ref 15.0–45.0)
MCH: 31.3 pg (ref 27.0–34.5)
MCHC: 34.9 g/dL (ref 32.0–36.0)
MCV: 89.7 fL (ref 81.0–99.0)
MPV: 9.2 fL (ref 7.2–13.2)
Monocytes %: 9.2 % (ref 4.0–12.0)
Monocytes Absolute: 0.9 10*3/uL (ref 0.3–1.0)
NRBC Absolute: 0 10*3/uL (ref 0.000–0.012)
NRBC Automated: 0 % (ref 0.0–0.2)
Neutrophils %: 60.2 % (ref 42.0–74.0)
Neutrophils Absolute: 5.6 10*3/uL (ref 1.6–7.3)
Platelets: 248 10*3/uL (ref 140–440)
RBC: 4.47 x10e6/mcL (ref 3.60–5.20)
RDW: 12.7 % (ref 11.0–16.0)
WBC: 9.3 10*3/uL (ref 3.8–10.6)

## 2023-04-05 LAB — COMPREHENSIVE METABOLIC PANEL
ALT: 21 U/L (ref 0–35)
AST: 25 U/L (ref 0–35)
Albumin/Globulin Ratio: 1.4 (ref 1.00–2.70)
Albumin: 4.6 g/dL (ref 3.5–5.2)
Alk Phosphatase: 90 U/L (ref 35–117)
Anion Gap: 13 mmol/L (ref 2–17)
BUN: 20 mg/dL (ref 6–20)
CALCIUM,CORRECTED,CCA: 9 mg/dL (ref 8.5–10.7)
CO2: 27 mmol/L (ref 22–29)
Calcium: 9.8 mg/dL (ref 8.5–10.7)
Chloride: 102 mmol/L (ref 98–107)
Creatinine: 1 mg/dL (ref 0.5–1.0)
Est, Glom Filt Rate: 66 mL/min/1.73m (ref >=60)
Globulin: 3.2 g/dL (ref 1.9–4.4)
Glucose: 102 mg/dL — ABNORMAL HIGH (ref 70–99)
Osmolaliy Calculated: 286 mOsm/kg (ref 270–287)
Potassium: 3.8 mmol/L (ref 3.5–5.3)
Sodium: 142 mmol/L (ref 135–145)
Total Bilirubin: 0.23 mg/dL (ref 0.00–1.20)
Total Protein: 7.8 g/dL (ref 5.7–8.3)

## 2023-04-05 LAB — TROPONIN: Troponin, High Sensitivity: <6 ng/L (ref 0–14)

## 2023-04-05 LAB — LACTATE, SEPSIS: Lactate, Sepsis: 0.8 mmol/L (ref 0.5–2.0)

## 2023-04-05 LAB — POCT GLUCOSE: POC Glucose: 94 mg/dL (ref 65.0–110.0)

## 2023-04-05 MED ORDER — SODIUM CHLORIDE 0.9 % IV BOLUS
0.9 | Freq: Once | INTRAVENOUS | Status: AC
Start: 2023-04-05 — End: 2023-04-05
  Administered 2023-04-05: 06:00:00 1000 mL via INTRAVENOUS

## 2023-04-05 NOTE — ED Provider Notes (Signed)
[facility] EMERGENCY DEPT  EMERGENCY DEPARTMENT ENCOUNTER      Pt Name: Joanna Vargas  MRN: [id number]  Birthdate 07-14-1966  Date of evaluation: 04/05/2023  Provider: Caroleen Hamman, MD  Provider evaluation time: 04/05/23 0120    CHIEF COMPLAINT       Chief Complaint   Patient presents with    Illness     Pt reports feeling nauseous and having abdominal pain like she has "trapped gas." Pt reports she now feels like she is very shaky and like she is going to pass out. Pt reports having no PMH and doesn't take any medications.          HISTORY OF PRESENT ILLNESS    Nausea, epigastric fullness sensation, "I just do not feel right".  "Jittery"  57 year old otherwise very healthy female presents with her spouse and son.  Notes that she had mild nausea and abdominal bloating sensation for couple days but tonight she felt worse.  Patient feels like she needs to belch.  She did belch which helped her symptoms but did not relieve them.  She denies chest pain or shortness of breath.  No cardiac pulmonary or vascular history  No recent illness, injury, travel etc.  Chest blood pressure 150/93.  Afebrile.  Heart rate 76.  Oxygen saturation normal 99% room air    The history is provided by the patient and the spouse.       Nursing Notes were reviewed.    REVIEW OF SYSTEMS     Review of Systems   Constitutional:  Negative for fever.   Respiratory:  Negative for shortness of breath.    Cardiovascular:  Negative for chest pain and palpitations.   Gastrointestinal:  Negative for abdominal pain, nausea and vomiting.   Genitourinary:  Negative for dysuria.   Musculoskeletal:  Negative for back pain.   Skin:  Negative for rash.   Neurological:  Negative for dizziness, weakness and headaches.   All other systems reviewed and are negative.      Except as noted above the remainder of the review of systems was reviewed and negative.     PAST MEDICAL HISTORY   No past medical history on file.    SURGICAL HISTORY     No past surgical history on  file.    CURRENT MEDICATIONS       Previous Medications    No medications on file       ALLERGIES     Patient has no known allergies.    FAMILY HISTORY     No family history on file.     SOCIAL HISTORY       Social History     Socioeconomic History    Marital status: Married       SCREENINGS         Glasgow Coma Scale  Eye Opening: Spontaneous  Best Verbal Response: Oriented  Best Motor Response: Obeys commands  Glasgow Coma Scale Score: 15                     CIWA Assessment  BP: (!) 140/88  Pulse: 77                 PHYSICAL EXAM       ED Triage Vitals [04/05/23 0126]   BP Temp Temp Source Pulse Respirations SpO2 Height Weight - Scale   (!) 150/93 98 F (36.7 C) Oral 76 16 99 % 1.6 m (5\' 3" ) 58.1 kg (128 lb)  Physical Exam  Vitals and nursing note reviewed.   Constitutional:       General: She is not in acute distress.  HENT:      Head: Normocephalic and atraumatic.      Mouth/Throat:      Mouth: Mucous membranes are moist.      Comments: Airway patent  Eyes:      Pupils: Pupils are equal, round, and reactive to light.   Cardiovascular:      Rate and Rhythm: Normal rate and regular rhythm.      Pulses: Normal pulses.      Heart sounds: Normal heart sounds.   Pulmonary:      Effort: Pulmonary effort is normal. No respiratory distress.      Breath sounds: Normal breath sounds. No wheezing or rhonchi.   Abdominal:      Palpations: Abdomen is soft.      Tenderness: There is no abdominal tenderness. There is no guarding or rebound.      Hernia: No hernia is present.   Musculoskeletal:         General: No tenderness.      Cervical back: Neck supple.      Right lower leg: No edema.      Left lower leg: No edema.      Comments: Neurovascular exam intact distally in all extremities   Skin:     General: Skin is warm and dry.   Neurological:      General: No focal deficit present.      Mental Status: She is alert and oriented to person, place, and time. Mental status is at baseline.   Psychiatric:         Mood and  Affect: Mood normal.         Behavior: Behavior normal.         Thought Content: Thought content normal.         Judgment: Judgment normal.         Procedures    DIAGNOSTIC RESULTS     EKG: All EKG's are interpreted by the Emergency Department Physician who either signs or Co-signs this chart in the absence of a cardiologist.    RADIOLOGY:   XR CHEST PORTABLE    (Results Pending)       LABS:  Labs Reviewed   COMPREHENSIVE METABOLIC PANEL - Abnormal; Notable for the following components:       Result Value    Glucose 102 (*)     All other components within normal limits   TROPONIN   CBC WITH AUTO DIFFERENTIAL   LACTATE, SEPSIS   TROPONIN   LACTATE, SEPSIS   POCT GLUCOSE       All other labs were within normal range or not returned as of this dictation.    EMERGENCY DEPARTMENT COURSE/REASSESSMENT and MDM:     ED Course as of 04/05/23 0251   Wed Apr 05, 2023   0139 EKG 12 Lead (SOB)  Sinus rhythm rate of 66.  No ectopy.  No acute infarct findings.  No prior available for comparison.  Clinically interpreted by me. [RG]   0201 Lactate, Sepsis: 0.8  nml [RG]   0201 CBC with Auto Differential:    WBC 9.3   RBC 4.47   Hemoglobin Quant 14.0   Hematocrit 40.1   MCV 89.7   MCH 31.3   MCHC 34.9   RDW 12.7   Platelet Count 248   MPV 9.2   NRBC Automated 0.0  NRBC Absolute 0.000   Neutrophils % 60.2   Lymphocytes 27.2   Monocytes % 9.2   Eosinophils % 2.5   Basophils % 0.5   Neutrophils Absolute 5.6   Lymphocytes Absolute 2.5   Monocytes Absolute 0.9   Eosinophils Absolute 0.2   Basophils Absolute 0.1   Immature Granulocytes % 0.4   Immature Grans (Abs) 0.04  nml [RG]   0201 XR CHEST PORTABLE  No acute findings on this AP portable chest x-ray.  No pulmonary edema.  No infiltrate.    Clinically interpreted by me.  Radiologist read pending. [RG]   0217 Troponin, High Sensitivity: <6  nl [RG]      ED Course User Index  [RG] Caroleen Hamman, MD       Medical Decision Making  Not feeling well described as this jittery and  epigastric discomfort.  No chest pain or shortness of breath.  No fever.  Vital signs essentially normal.  Workup nonspecific.  57 year old female otherwise healthy.  No evidence of cardiovascular or pulmonary or other severe etiology.  Discussed workup with patient.  Etiology unclear.  Possible early viral symptoms.  Patient stable discharge.    Amount and/or Complexity of Data Reviewed  Labs: ordered. Decision-making details documented in ED Course.  Radiology: ordered. Decision-making details documented in ED Course.  ECG/medicine tests: ordered. Decision-making details documented in ED Course.    Risk  Prescription drug management.         FINAL IMPRESSION      1. Generalized weakness          DISPOSITION/PLAN   DISPOSITION Decision To Discharge 04/05/2023 02:34:43 AM      PATIENT REFERRED TO:  Your Primary Care Provider            DISCHARGE MEDICATIONS:  New Prescriptions    No medications on file         (Please note that portions of this note were completed with a voice recognition program.  Efforts were made to edit the dictations but occasionally words are mis-transcribed.)    Caroleen Hamman, MD (electronically signed)  Emergency Medicine             Caroleen Hamman, MD  04/05/23 [phone]

## 2023-04-06 LAB — EKG 12-LEAD
P Axis: 56 degrees
P-R Interval: 140 ms
Q-T Interval: 390 ms
QRS Duration: 86 ms
QTc Calculation (Bazett): 403 ms
R Axis: 67 degrees
T Axis: 69 degrees
Ventricular Rate: 66 {beats}/min

## 2023-04-20 NOTE — Telephone Encounter (Signed)
ED FOLLOW UP PROJECT- [place]
